# Patient Record
Sex: Male | Born: 1960 | Race: Black or African American | Hispanic: No | Marital: Married | Smoking: Never smoker
Health system: Southern US, Community
[De-identification: ages and names within clinical notes are randomized; demographics above are authoritative.]

## PROBLEM LIST (undated history)

## (undated) ENCOUNTER — Emergency Department (HOSPITAL_BASED_OUTPATIENT_CLINIC_OR_DEPARTMENT_OTHER): Discharge status: Absent without leave | Payer: BC Managed Care – PPO

## (undated) DIAGNOSIS — E7800 Pure hypercholesterolemia, unspecified: Secondary | ICD-10-CM

## (undated) HISTORY — PX: ROTATOR CUFF REPAIR: SHX139

## (undated) HISTORY — PX: OTHER SURGICAL HISTORY: SHX169

---

## 2006-05-13 ENCOUNTER — Ambulatory Visit (HOSPITAL_COMMUNITY): Admission: RE | Admit: 2006-05-13 | Discharge: 2006-05-13 | Payer: Self-pay | Admitting: Orthopaedic Surgery

## 2019-02-11 ENCOUNTER — Other Ambulatory Visit: Payer: Self-pay

## 2019-02-11 ENCOUNTER — Emergency Department (HOSPITAL_BASED_OUTPATIENT_CLINIC_OR_DEPARTMENT_OTHER)
Admission: EM | Admit: 2019-02-11 | Discharge: 2019-02-11 | Disposition: A | Discharge status: Home / Self Care | Payer: BC Managed Care – PPO | Attending: Emergency Medicine | Admitting: Emergency Medicine

## 2019-02-11 ENCOUNTER — Emergency Department (HOSPITAL_BASED_OUTPATIENT_CLINIC_OR_DEPARTMENT_OTHER): Payer: BC Managed Care – PPO

## 2019-02-11 ENCOUNTER — Encounter (HOSPITAL_BASED_OUTPATIENT_CLINIC_OR_DEPARTMENT_OTHER): Payer: Self-pay | Admitting: Emergency Medicine

## 2019-02-11 DIAGNOSIS — Y9389 Activity, other specified: Secondary | ICD-10-CM | POA: Diagnosis not present

## 2019-02-11 DIAGNOSIS — Y999 Unspecified external cause status: Secondary | ICD-10-CM | POA: Diagnosis not present

## 2019-02-11 DIAGNOSIS — Z79899 Other long term (current) drug therapy: Secondary | ICD-10-CM | POA: Diagnosis not present

## 2019-02-11 DIAGNOSIS — Y929 Unspecified place or not applicable: Secondary | ICD-10-CM | POA: Diagnosis not present

## 2019-02-11 DIAGNOSIS — W1789XA Other fall from one level to another, initial encounter: Secondary | ICD-10-CM | POA: Diagnosis not present

## 2019-02-11 DIAGNOSIS — W19XXXA Unspecified fall, initial encounter: Secondary | ICD-10-CM

## 2019-02-11 DIAGNOSIS — S8991XA Unspecified injury of right lower leg, initial encounter: Secondary | ICD-10-CM | POA: Diagnosis present

## 2019-02-11 HISTORY — DX: Pure hypercholesterolemia, unspecified: E78.00

## 2019-02-11 MED ORDER — HYDROCODONE-ACETAMINOPHEN 5-325 MG PO TABS: 1.0000 | ORAL_TABLET | Freq: Four times a day (QID) | ORAL | 0 refills | Status: DC | PRN

## 2019-02-11 NOTE — ED Provider Notes (Signed)
[facility] EMERGENCY DEPARTMENT Provider Note   CSN: [id number] Arrival date & time: 02/11/19  [id number]     History   Chief Complaint Chief Complaint  Patient presents with  . Fall    HPI Kenneth Gillespie is a 58 y.o. male.     Patient with a fall shortly prior to arrival.  He was up on a platform about 3 feet high stepped off got his leg caught his right calf area caught while falling.  Between the scaffolding and grill.  Since the time of the fall the left calf is swollen significantly.  No problems with a prior to the injury.  No other injuries.  Patient not on any medications.     Past Medical History:  Diagnosis Date  . Hypercholesterolemia     There are no active problems to display for this patient.   Past Surgical History:  Procedure Laterality Date  . c-spine    . ROTATOR CUFF REPAIR Bilateral         Home Medications    Prior to Admission medications   Medication Sig Start Date End Date Taking? Authorizing Provider  HYDROcodone-acetaminophen (NORCO/VICODIN) 5-325 MG tablet Take 1 tablet by mouth every 6 (six) hours as needed for moderate pain. 02/11/19   Vanetta MuldersZackowski, Kenneth Koller, MD    Family History No family history on file.  Social History Social History   Tobacco Use  . Smoking status: Not on file  Substance Use Topics  . Alcohol use: Not on file  . Drug use: Not on file     Allergies   Patient has no known allergies.   Review of Systems Review of Systems  Constitutional: Negative for chills and fever.  HENT: Negative for rhinorrhea and sore throat.   Eyes: Negative for visual disturbance.  Respiratory: Negative for cough and shortness of breath.   Cardiovascular: Positive for leg swelling. Negative for chest pain.  Gastrointestinal: Negative for abdominal pain, diarrhea, nausea and vomiting.  Genitourinary: Negative for dysuria.  Musculoskeletal: Negative for back pain and neck pain.  Skin: Negative for rash.  Neurological:  Negative for dizziness, light-headedness and headaches.  Hematological: Does not bruise/bleed easily.  Psychiatric/Behavioral: Negative for confusion.     Physical Exam Updated Vital Signs BP (!) 137/96   Pulse 60   Temp 98.3 F (36.8 C) (Oral)   Resp 18   Ht 1.829 m (6')   Wt 114.3 kg   SpO2 100%   BMI 34.18 kg/m   Physical Exam Vitals signs and nursing note reviewed.  Constitutional:      Appearance: Normal appearance. He is well-developed.  HENT:     Head: Normocephalic and atraumatic.  Eyes:     Extraocular Movements: Extraocular movements intact.     Conjunctiva/sclera: Conjunctivae normal.     Pupils: Pupils are equal, round, and reactive to light.  Neck:     Musculoskeletal: Normal range of motion and neck supple.  Cardiovascular:     Rate and Rhythm: Normal rate and regular rhythm.     Heart sounds: No murmur.  Pulmonary:     Effort: Pulmonary effort is normal. No respiratory distress.     Breath sounds: Normal breath sounds.  Abdominal:     Palpations: Abdomen is soft.     Tenderness: There is no abdominal tenderness.  Musculoskeletal:        General: Swelling and tenderness present.     Comments: Swelling to the right calf and anterior part of the leg.  No knee pain no swelling of the knee.  No swelling of the ankle.  Dorsalis pedis pulses 2+.  Good cap refill of the toes.  No obvious deformity.  Skin:    General: Skin is warm and dry.     Capillary Refill: Capillary refill takes less than 2 seconds.  Neurological:     General: No focal deficit present.     Mental Status: He is alert and oriented to person, place, and time.     Cranial Nerves: No cranial nerve deficit.     Sensory: No sensory deficit.     Motor: No weakness.      ED Treatments / Results  Labs (all labs ordered are listed, but only abnormal results are displayed) Labs Reviewed - No data to display  EKG None  Radiology Dg Tibia/fibula Left  Result Date: 02/11/2019 CLINICAL  DATA:  Fall, pain EXAM: LEFT TIBIA AND FIBULA - 2 VIEW COMPARISON:  None. FINDINGS: No fracture or dislocation of the left tibia or fibula. Soft tissues are unremarkable. IMPRESSION: No fracture or dislocation of the left tibia or fibula. Soft tissues are unremarkable. Electronically Signed   By: Eddie Candle M.D.   On: 02/11/2019 20:16    Procedures Procedures (including critical care time)  Medications Ordered in ED Medications - No data to display   Initial Impression / Assessment and Plan / ED Course  I have reviewed the triage vital signs and the nursing notes.  Pertinent labs & imaging results that were available during my care of the patient were reviewed by me and considered in my medical decision making (see chart for details).        X-ray showed no bony injury.  Everything soft tissue.  Patient able to ambulate.  Will treat with hydrocodone.  Elevation.  Work note to be out of work for 2 days.  Follow-up with sports medicine if things do not improve.  Final Clinical Impressions(s) / ED Diagnoses   Final diagnoses:  Fall, initial encounter  Lower leg injury, right, initial encounter    ED Discharge Orders         Ordered    HYDROcodone-acetaminophen (NORCO/VICODIN) 5-325 MG tablet  Every 6 hours PRN     02/11/19 2029           Fredia Sorrow, MD 02/11/19 2033

## 2019-02-11 NOTE — Discharge Instructions (Addendum)
X-ray showed no bony injury.  Elevate the right leg is much as possible.  Take the hydrocodone as needed for pain.  Work note provided to be out of work for the next 2 days.  Follow-up with sports medicine or your doctor if things are not improving over a couple days.

## 2019-02-11 NOTE — ED Triage Notes (Signed)
Pt was working outside on his ladder when he fell off, approx 3 feet high. Foot got caught when he was falling and now his left calf is swollen. Pt has home ice pack on it during triage

## 2019-02-20 ENCOUNTER — Ambulatory Visit: Payer: Self-pay

## 2019-02-20 ENCOUNTER — Other Ambulatory Visit: Payer: Self-pay

## 2019-02-20 ENCOUNTER — Ambulatory Visit: Payer: BC Managed Care – PPO | Admitting: Family Medicine

## 2019-02-20 ENCOUNTER — Encounter: Payer: Self-pay | Admitting: Family Medicine

## 2019-02-20 VITALS — BP 150/90 | Ht 72.0 in | Wt 250.0 lb

## 2019-02-20 DIAGNOSIS — M79605 Pain in left leg: Secondary | ICD-10-CM | POA: Diagnosis not present

## 2019-02-20 DIAGNOSIS — T148XXA Other injury of unspecified body region, initial encounter: Secondary | ICD-10-CM | POA: Diagnosis not present

## 2019-02-20 NOTE — Progress Notes (Signed)
Kenneth Gillespie - 58 y.o. male MRN [id number]  Date of birth: 04/11/1961  SUBJECTIVE:  Including CC & ROS.  Chief Complaint  Patient presents with  . Leg Injury    left leg x 02/11/2019    Kenneth Gillespie is a 58 y.o. male that is presenting with left lower leg pain.  He had an accident about a week and a half ago where he fell backwards in his anterior tibia was stuck against a grill.  He was evaluated the emergency room and no signs of fracture occurred.  Since that time he is ongoing swelling and pain over the anterior and distal aspect of the lower leg.  Denies any redness.  The pain is occurring over the anterior medial aspect of the proximal tibia. No numbness or tingling. Having some ecchymosis around the ankle.  Independent review of the left tibia-fibula x-ray from 8/23 shows no fracture   Review of Systems  Constitutional: Negative for fever.  HENT: Negative for congestion.   Respiratory: Negative for cough.   Cardiovascular: Positive for leg swelling. Negative for chest pain.  Gastrointestinal: Negative for abdominal pain.  Musculoskeletal: Positive for gait problem.  Skin: Positive for color change.  Neurological: Negative for weakness.  Hematological: Negative for adenopathy.    HISTORY: Past Medical, Surgical, Social, and Family History Reviewed & Updated per EMR.   Pertinent Historical Findings include:  Past Medical History:  Diagnosis Date  . Hypercholesterolemia     Past Surgical History:  Procedure Laterality Date  . c-spine    . ROTATOR CUFF REPAIR Bilateral     No Known Allergies  No family history on file.   Social History   Socioeconomic History  . Marital status: Married    Spouse name: Not on file  . Number of children: Not on file  . Years of education: Not on file  . Highest education level: Not on file  Occupational History  . Not on file  Social Needs  . Financial resource strain: Not on file  . Food insecurity    Worry: Not on  file    Inability: Not on file  . Transportation needs    Medical: Not on file    Non-medical: Not on file  Tobacco Use  . Smoking status: Never Smoker  . Smokeless tobacco: Never Used  Substance and Sexual Activity  . Alcohol use: Not on file  . Drug use: Not on file  . Sexual activity: Not on file  Lifestyle  . Physical activity    Days per week: Not on file    Minutes per session: Not on file  . Stress: Not on file  Relationships  . Social Herbalist on phone: Not on file    Gets together: Not on file    Attends religious service: Not on file    Active member of club or organization: Not on file    Attends meetings of clubs or organizations: Not on file    Relationship status: Not on file  . Intimate partner violence    Fear of current or ex partner: Not on file    Emotionally abused: Not on file    Physically abused: Not on file    Forced sexual activity: Not on file  Other Topics Concern  . Not on file  Social History Narrative  . Not on file     PHYSICAL EXAM:  VS: BP (!) 150/90   Ht 6' (1.829 m)   [employer]  250 lb (113.4 kg)   BMI 33.91 kg/m  Physical Exam Gen: NAD, alert, cooperative with exam, well-appearing ENT: normal lips, normal nasal mucosa,  Eye: normal EOM, normal conjunctiva and lids CV:  Unilateral left lower leg swelling, +2 pedal pulses   Resp: no accessory muscle use, non-labored,  Skin: no rashes, no areas of induration  Neuro: normal tone, normal sensation to touch Psych:  normal insight, alert and oriented MSK:  Left leg:  Obvious swelling around the calf and distally  Normal plantarflexion and dorsal flexion  Normal knee ROM  Normal strength to resistance  TTP over the medial proximal anterior tibia NVI  Limited ultrasound: left lower leg:  Large hematoma observed over the anterior medial proximal tibia.  It is roughly 4 cm in diameter horizontally. There is distention of the veins in the posterior compartment but they are  all compressible. No fracture of the tibia appreciated. Normal-appearing posterior tibialis at the ankle. No ankle effusion. Significant soft tissue swelling throughout the lower extremity.   Summary: Large hematoma over the anterior proximal tibia medially  Ultrasound and interpretation by Clare GandyJeremy Keelie Zemanek, MD     Aspiration/Injection Procedure Note Kenneth Gillespie 01/06/1961  Procedure: Aspiration Indications: Left lower leg pain  Procedure Details Consent: Risks of procedure as well as the alternatives and risks of each were explained to the (patient/caregiver).  Consent for procedure obtained. Time Out: Verified patient identification, verified procedure, site/side was marked, verified correct patient position, special equipment/implants available, medications/allergies/relevent history reviewed, required imaging and test results available.  Performed.  The area was cleaned with iodine and alcohol swabs.    The left lower leg hematoma was injected using 6 cc's of 1% lidocaine with a 25 1 1/2" needle.  An 18-gauge 1-1/2 inch needle was inserted for aspiration.  Ultrasound was used. Images were obtained in long views showing the injection.    Amount of Fluid Aspirated: minimal amount Character of Fluid: bloody Fluid was sent for:n/a  A sterile dressing was applied.  Patient did tolerate procedure well.     ASSESSMENT & PLAN:   Left leg pain Having significant swelling and hematoma apparent on ultrasound.  Having venous distention but compressible so less likely for DVT. -Aspiration but unable to generate significant sample.  Was able to express more as his pain tolerated. -Counseled on taking a baby dose aspirin. -Counseled on compression, heat, and movement -Given indications for infection and DVT. -Follow-up this Friday

## 2019-02-20 NOTE — Assessment & Plan Note (Signed)
Having significant swelling and hematoma apparent on ultrasound.  Having venous distention but compressible so less likely for DVT. -Aspiration but unable to generate significant sample.  Was able to express more as his pain tolerated. -Counseled on taking a baby dose aspirin. -Counseled on compression, heat, and movement -Given indications for infection and DVT. -Follow-up this Friday

## 2019-02-20 NOTE — Patient Instructions (Signed)
Nice to meet you Please use the ace wrap  Please pump your foot like you're pushing on and off the gas  Please try heat on the area  Please let me know if the pain becomes worse, gets red or more swollen.   Please send me a message in MyChart with any questions or updates.  Please see [place] back on Friday.   --Dr. Raeford Razor

## 2019-02-21 ENCOUNTER — Ambulatory Visit (HOSPITAL_BASED_OUTPATIENT_CLINIC_OR_DEPARTMENT_OTHER)
Admission: RE | Admit: 2019-02-21 | Discharge: 2019-02-21 | Disposition: A | Discharge status: Home / Self Care | Payer: BC Managed Care – PPO | Source: Ambulatory Visit | Attending: Family Medicine | Admitting: Family Medicine

## 2019-02-21 ENCOUNTER — Other Ambulatory Visit: Payer: Self-pay | Admitting: Family Medicine

## 2019-02-21 DIAGNOSIS — M79605 Pain in left leg: Secondary | ICD-10-CM | POA: Diagnosis present

## 2019-02-21 NOTE — Progress Notes (Signed)
Spoke with patient about leg swelling. Will go ahead and order duplex.   Rosemarie Ax, MD [facility] 02/21/2019, 8:20 AM

## 2019-02-22 ENCOUNTER — Telehealth: Payer: Self-pay | Admitting: Family Medicine

## 2019-02-22 NOTE — Telephone Encounter (Signed)
Called patient about no blood clot.   Rosemarie Ax, MD [facility] 02/22/2019, 8:35 AM

## 2019-02-23 ENCOUNTER — Other Ambulatory Visit: Payer: Self-pay

## 2019-02-23 ENCOUNTER — Ambulatory Visit: Payer: BC Managed Care – PPO | Admitting: Family Medicine

## 2019-02-23 ENCOUNTER — Encounter: Payer: Self-pay | Admitting: Family Medicine

## 2019-02-23 DIAGNOSIS — M79605 Pain in left leg: Secondary | ICD-10-CM

## 2019-02-23 NOTE — Patient Instructions (Signed)
Good to see you Please continue with the compression and elevation  You don't have to wrap it at night  Please try ibuprofen or tylenol for pain. You can take these together at different times.  Please try heat and massage   Please send me a message in MyChart with any questions or updates.  Please see me back in 3 weeks.   --Dr. Raeford Razor

## 2019-02-23 NOTE — Assessment & Plan Note (Signed)
Pain and swelling have improved.  Hematoma is still tender to touch.  He was negative for blood clot with imaging. -Counseled on compression and supportive care. -Follow-up in 3 weeks.

## 2019-02-23 NOTE — Progress Notes (Signed)
Kenneth Gillespie - 58 y.o. male MRN [id number]  Date of birth: 01/18/1961  SUBJECTIVE:  Including CC & ROS.  Chief Complaint  Patient presents with  . Follow-up    follow up for left leg    Kenneth Gillespie is a 58 y.o. male that is following up for his left leg pain and swelling.  Imaging was negative for blood clot this past week.  He is having improvement of his swelling.  He still has pain and seems to be worse at night.  He is able to walk and do some of the normal activities.   Review of Systems  Constitutional: Negative for fever.  HENT: Negative for congestion.   Respiratory: Negative for cough.   Cardiovascular: Positive for leg swelling. Negative for chest pain.  Gastrointestinal: Negative for abdominal pain.  Musculoskeletal: Negative for gait problem.  Neurological: Negative for weakness.  Hematological: Negative for adenopathy.    HISTORY: Past Medical, Surgical, Social, and Family History Reviewed & Updated per EMR.   Pertinent Historical Findings include:  Past Medical History:  Diagnosis Date  . Hypercholesterolemia     Past Surgical History:  Procedure Laterality Date  . c-spine    . ROTATOR CUFF REPAIR Bilateral     No Known Allergies  No family history on file.   Social History   Socioeconomic History  . Marital status: Married    Spouse name: Not on file  . Number of children: Not on file  . Years of education: Not on file  . Highest education level: Not on file  Occupational History  . Not on file  Social Needs  . Financial resource strain: Not on file  . Food insecurity    Worry: Not on file    Inability: Not on file  . Transportation needs    Medical: Not on file    Non-medical: Not on file  Tobacco Use  . Smoking status: Never Smoker  . Smokeless tobacco: Never Used  Substance and Sexual Activity  . Alcohol use: Not on file  . Drug use: Not on file  . Sexual activity: Not on file  Lifestyle  . Physical activity    Days per  week: Not on file    Minutes per session: Not on file  . Stress: Not on file  Relationships  . Social Herbalist on phone: Not on file    Gets together: Not on file    Attends religious service: Not on file    Active member of club or organization: Not on file    Attends meetings of clubs or organizations: Not on file    Relationship status: Not on file  . Intimate partner violence    Fear of current or ex partner: Not on file    Emotionally abused: Not on file    Physically abused: Not on file    Forced sexual activity: Not on file  Other Topics Concern  . Not on file  Social History Narrative  . Not on file     PHYSICAL EXAM:  VS: BP 135/88   Ht 6' (1.829 m)   Wt 250 lb (113.4 kg)   BMI 33.91 kg/m  Physical Exam Gen: NAD, alert, cooperative with exam, well-appearing ENT: normal lips, normal nasal mucosa,  Eye: normal EOM, normal conjunctiva and lids CV:   +2 pedal pulses   Resp: no accessory muscle use, non-labored,  Skin: no rashes, no areas of induration  Neuro: normal tone, normal  sensation to touch Psych:  normal insight, alert and oriented MSK:  Left leg: Swelling has improved. Still having pain to palpation over the hematoma and anterior tibia. Normal knee range of motion. Normal ankle range of motion. Normal strength to resistance. Neurovascular intact     ASSESSMENT & PLAN:   Left leg pain Pain and swelling have improved.  Hematoma is still tender to touch.  He was negative for blood clot with imaging. -Counseled on compression and supportive care. -Follow-up in 3 weeks.

## 2019-03-20 ENCOUNTER — Other Ambulatory Visit: Payer: Self-pay

## 2019-03-20 ENCOUNTER — Ambulatory Visit: Payer: BC Managed Care – PPO | Admitting: Family Medicine

## 2019-03-20 ENCOUNTER — Ambulatory Visit: Payer: Self-pay

## 2019-03-20 VITALS — BP 132/80 | Ht 72.0 in | Wt 250.0 lb

## 2019-03-20 DIAGNOSIS — G8929 Other chronic pain: Secondary | ICD-10-CM

## 2019-03-20 DIAGNOSIS — M79605 Pain in left leg: Secondary | ICD-10-CM | POA: Diagnosis not present

## 2019-03-20 DIAGNOSIS — M25562 Pain in left knee: Secondary | ICD-10-CM | POA: Diagnosis not present

## 2019-03-20 DIAGNOSIS — M25561 Pain in right knee: Secondary | ICD-10-CM

## 2019-03-20 MED ORDER — METHYLPREDNISOLONE ACETATE 40 MG/ML IJ SUSP: 40.0000 mg | Freq: Once | INTRAMUSCULAR | Status: AC

## 2019-03-20 MED ORDER — PENNSAID 2 % TD SOLN: 1.0000 "application " | Freq: Two times a day (BID) | TRANSDERMAL | 3 refills | Status: DC

## 2019-03-20 NOTE — Assessment & Plan Note (Signed)
Acute on chronic in nature.  Likely related to degenerative meniscus and/or arthritic type changes. -Injections today. -Pennsaid. -Counseled on home exercise therapy and supportive care. -If no improvement consider physical therapy.  Consider imaging

## 2019-03-20 NOTE — Patient Instructions (Signed)
Good to see you Please try ice  Please try the exercises  Please try the rub on medicine for the knees  Please try heat on the hematoma   Please send me a message in MyChart with any questions or updates.  Please see me back in 4 weeks.   --Dr. Raeford Razor

## 2019-03-20 NOTE — Assessment & Plan Note (Signed)
Hematoma still present but pain has improved.  Still having some swelling intermittently. -Counseled on supportive care. - provided indications to follow up.

## 2019-03-20 NOTE — Progress Notes (Signed)
[name] - 58 y.o. male MRN [id number]  Date of birth: [birth year]  SUBJECTIVE:  Including CC & ROS.  No chief complaint on file.   Kenneth Gillespie is a 58 y.o. male that is following up for his left leg hematoma and presenting with acute on chronic bilateral knee pain.  The hematoma has improved in size.  He does not have pain when it is touched now.  He still does have swelling in the left lower extremity intermittently.  He tries heat and massage on the area.  Denies any redness.  Presenting with acute on chronic bilateral knee pain.  The knee pain started a few weeks ago.  He denies any inciting event or trauma.  He has had steroid injections in the past.  Is been several years since his injection.  The pain is occurring in the medial joint line.  He has clicking and popping.  Denies any mechanical symptoms.  Pain is localized to the knee.  No numbness or tingling   Review of Systems  Constitutional: Negative for fever.  HENT: Negative for congestion.   Respiratory: Negative for cough.   Cardiovascular: Negative for chest pain.  Gastrointestinal: Negative for abdominal pain.  Musculoskeletal: Positive for arthralgias.  Skin: Negative for color change.  Neurological: Negative for weakness.  Hematological: Negative for adenopathy.    HISTORY: Past Medical, Surgical, Social, and Family History Reviewed & Updated per EMR.   Pertinent Historical Findings include:  Past Medical History:  Diagnosis Date  . Hypercholesterolemia     Past Surgical History:  Procedure Laterality Date  . c-spine    . ROTATOR CUFF REPAIR Bilateral     No Known Allergies  No family history on file.   Social History   Socioeconomic History  . Marital status: Married    Spouse name: Not on file  . Number of children: Not on file  . Years of education: Not on file  . Highest education level: Not on file  Occupational History  . Not on file  Social Needs  . Financial resource strain: Not  on file  . Food insecurity    Worry: Not on file    Inability: Not on file  . Transportation needs    Medical: Not on file    Non-medical: Not on file  Tobacco Use  . Smoking status: Never Smoker  . Smokeless tobacco: Never Used  Substance and Sexual Activity  . Alcohol use: Not on file  . Drug use: Not on file  . Sexual activity: Not on file  Lifestyle  . Physical activity    Days per week: Not on file    Minutes per session: Not on file  . Stress: Not on file  Relationships  . Social Herbalist on phone: Not on file    Gets together: Not on file    Attends religious service: Not on file    Active member of club or organization: Not on file    Attends meetings of clubs or organizations: Not on file    Relationship status: Not on file  . Intimate partner violence    Fear of current or ex partner: Not on file    Emotionally abused: Not on file    Physically abused: Not on file    Forced sexual activity: Not on file  Other Topics Concern  . Not on file  Social History Narrative  . Not on file     PHYSICAL EXAM:  VS:  BP 132/80   Ht 6' (1.829 m)   Wt 250 lb (113.4 kg)   BMI 33.91 kg/m  Physical Exam Gen: NAD, alert, cooperative with exam, well-appearing ENT: normal lips, normal nasal mucosa,  Eye: normal EOM, normal conjunctiva and lids CV:   +2 pedal pulses   Resp: no accessory muscle use, non-labored,  Skin: no rashes, no areas of induration  Neuro: normal tone, normal sensation to touch Psych:  normal insight, alert and oriented MSK:  Left lower leg:  Hematoma appreciated in the left medial proximal tibia.  No tenderness to palpation.  No overlying redness. Left and right knee: No obvious effusion. Tenderness to palpation of the medial joint line bilaterally. Normal range of motion. Normal strength resistance. Normal McMurray's test. No instability with valgus or varus stress testing. Neurovascular intact   Aspiration/Injection Procedure  Note Kenneth Gillespie 08-08-1960  Procedure: Injection Indications: Left knee pain  Procedure Details Consent: Risks of procedure as well as the alternatives and risks of each were explained to the (patient/caregiver).  Consent for procedure obtained. Time Out: Verified patient identification, verified procedure, site/side was marked, verified correct patient position, special equipment/implants available, medications/allergies/relevent history reviewed, required imaging and test results available.  Performed.  The area was cleaned with iodine and alcohol swabs.    The left knee superior lateral suprapatellar pouch was injected using 1 cc's of 40 mg Depo-Medrol and 4 cc's of 0.25% bupivacaine with a 25 1 1/2" needle.  Ultrasound was used. Images were obtained in long views showing the injection.     A sterile dressing was applied.  Patient did tolerate procedure well.    Aspiration/Injection Procedure Note Kenneth Gillespie 1960-10-22  Procedure: Injection Indications: Right knee pain  Procedure Details Consent: Risks of procedure as well as the alternatives and risks of each were explained to the (patient/caregiver).  Consent for procedure obtained. Time Out: Verified patient identification, verified procedure, site/side was marked, verified correct patient position, special equipment/implants available, medications/allergies/relevent history reviewed, required imaging and test results available.  Performed.  The area was cleaned with iodine and alcohol swabs.    The right knee superior lateral suprapatellar pouch was injected using 1 cc's of 40 mg Depo-Medrol and 4 cc's of 0.25% bupivacaine with a 25 1 1/2" needle.  Ultrasound was used. Images were obtained in long views showing the injection.     A sterile dressing was applied.  Patient did tolerate procedure well.    ASSESSMENT & PLAN:   Chronic pain of both knees Acute on chronic in nature.  Likely related to degenerative  meniscus and/or arthritic type changes. -Injections today. -Pennsaid. -Counseled on home exercise therapy and supportive care. -If no improvement consider physical therapy.  Consider imaging  Left leg pain Hematoma still present but pain has improved.  Still having some swelling intermittently. -Counseled on supportive care. - provided indications to follow up.

## 2019-04-17 ENCOUNTER — Other Ambulatory Visit: Payer: Self-pay

## 2019-04-17 ENCOUNTER — Ambulatory Visit: Payer: BC Managed Care – PPO | Admitting: Family Medicine

## 2019-04-17 ENCOUNTER — Encounter: Payer: Self-pay | Admitting: Family Medicine

## 2019-04-17 DIAGNOSIS — M79605 Pain in left leg: Secondary | ICD-10-CM

## 2019-04-17 NOTE — Assessment & Plan Note (Signed)
Hematoma is slowly improving. -Counseled on supportive care. -Can follow-up as needed.

## 2019-04-17 NOTE — Progress Notes (Signed)
RACE LATOUR - 58 y.o. male MRN [id number]  Date of birth: 16-Mar-1961  SUBJECTIVE:  Including CC & ROS.  Chief Complaint  Patient presents with  . Follow-up    follow up for right leg    Mitesh C Cauble is a 58 y.o. male that is following up for his hematoma.  He reports that he is doing well with no pain.  Swelling is slowly improved and the pain has subsided.  Denies any redness.  Feels like it is almost back to normal.   Review of Systems  Constitutional: Negative for fever.  HENT: Negative for congestion.   Respiratory: Negative for cough.   Cardiovascular: Negative for chest pain.  Gastrointestinal: Negative for abdominal pain.  Musculoskeletal: Negative for gait problem.  Neurological: Negative for weakness.  Hematological: Negative for adenopathy.    HISTORY: Past Medical, Surgical, Social, and Family History Reviewed & Updated per EMR.   Pertinent Historical Findings include:  Past Medical History:  Diagnosis Date  . Hypercholesterolemia     Past Surgical History:  Procedure Laterality Date  . c-spine    . ROTATOR CUFF REPAIR Bilateral     No Known Allergies  No family history on file.   Social History   Socioeconomic History  . Marital status: Married    Spouse name: Not on file  . Number of children: Not on file  . Years of education: Not on file  . Highest education level: Not on file  Occupational History  . Not on file  Social Needs  . Financial resource strain: Not on file  . Food insecurity    Worry: Not on file    Inability: Not on file  . Transportation needs    Medical: Not on file    Non-medical: Not on file  Tobacco Use  . Smoking status: Never Smoker  . Smokeless tobacco: Never Used  Substance and Sexual Activity  . Alcohol use: Not on file  . Drug use: Not on file  . Sexual activity: Not on file  Lifestyle  . Physical activity    Days per week: Not on file    Minutes per session: Not on file  . Stress: Not on file   Relationships  . Social Musician on phone: Not on file    Gets together: Not on file    Attends religious service: Not on file    Active member of club or organization: Not on file    Attends meetings of clubs or organizations: Not on file    Relationship status: Not on file  . Intimate partner violence    Fear of current or ex partner: Not on file    Emotionally abused: Not on file    Physically abused: Not on file    Forced sexual activity: Not on file  Other Topics Concern  . Not on file  Social History Narrative  . Not on file     PHYSICAL EXAM:  VS: BP (!) 143/87   Pulse 60   Ht 6' (1.829 m)   Wt 250 lb (113.4 kg)   BMI 33.91 kg/m  Physical Exam Gen: NAD, alert, cooperative with exam, well-appearing ENT: normal lips, normal nasal mucosa,  Eye: normal EOM, normal conjunctiva and lids CV:  no edema, +2 pedal pulses   Resp: no accessory muscle use, non-labored,   Skin: no rashes, no areas of induration  Neuro: normal tone, normal sensation to touch Psych:  normal insight, alert and oriented  MSK:  Left tibia: Hematoma still palpated in the left proximal medial tibia. No redness. Normal knee range of motion. Normal plantarflexion and dorsiflexion. Neurovascularly intact     ASSESSMENT & PLAN:   Left leg pain Hematoma is slowly improving. -Counseled on supportive care. -Can follow-up as needed.

## 2020-10-14 IMAGING — DX LEFT TIBIA AND FIBULA - 2 VIEW
4 series · 4 of 4 positions shown · non-contrast
Comparison: None.

CLINICAL DATA: Fall, pain

EXAM:
LEFT TIBIA AND FIBULA - 2 VIEW

[tibia ap (1 of 2)]
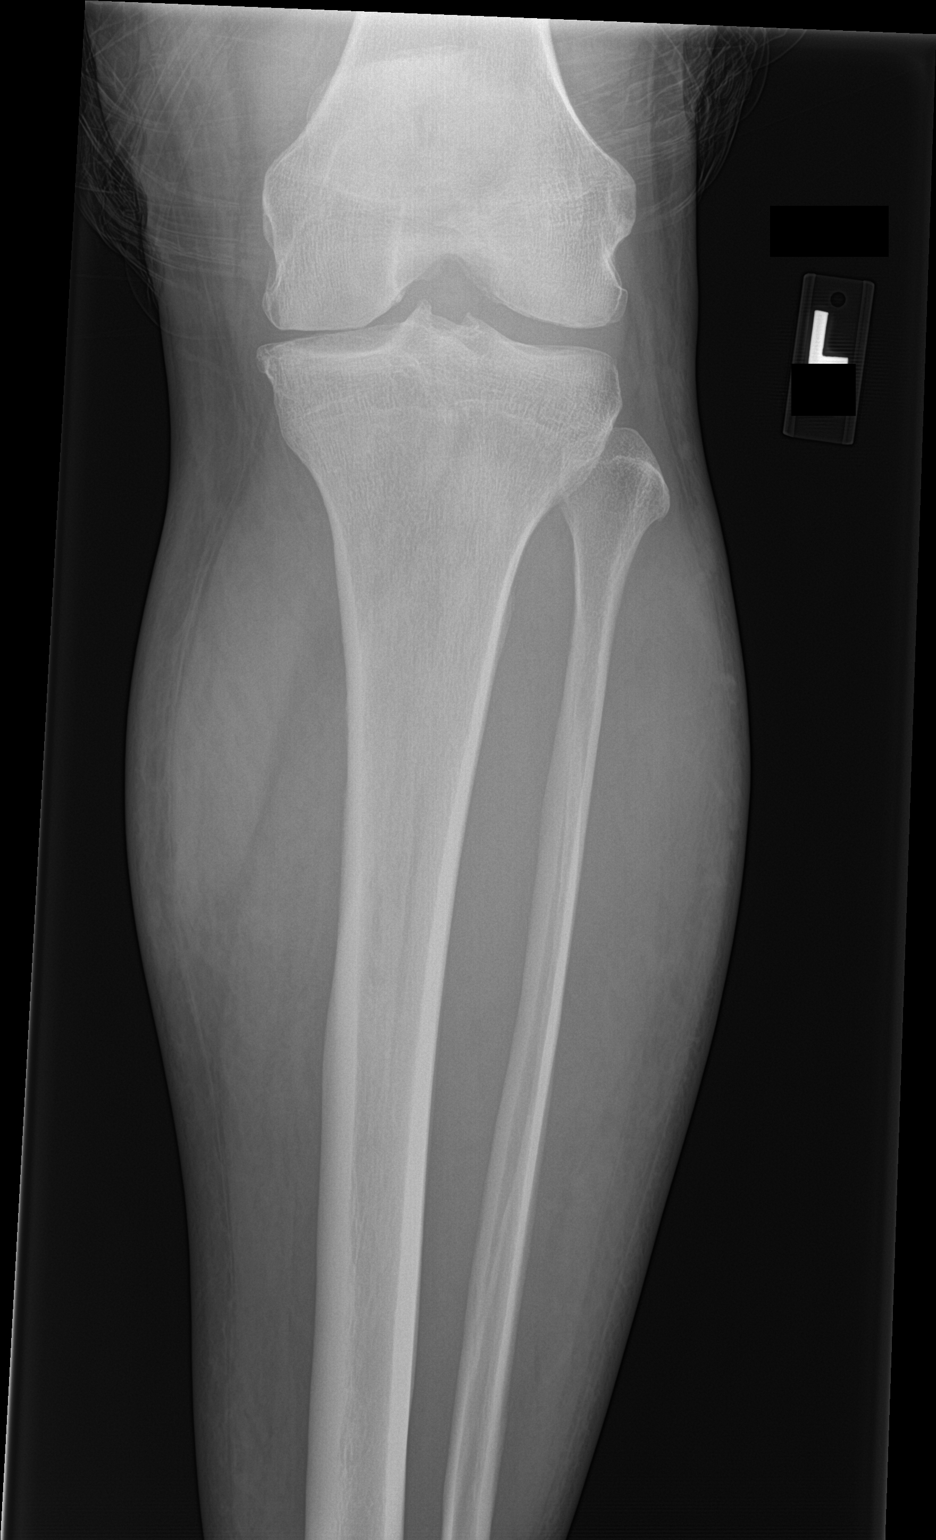

[tibia ap (2 of 2)]
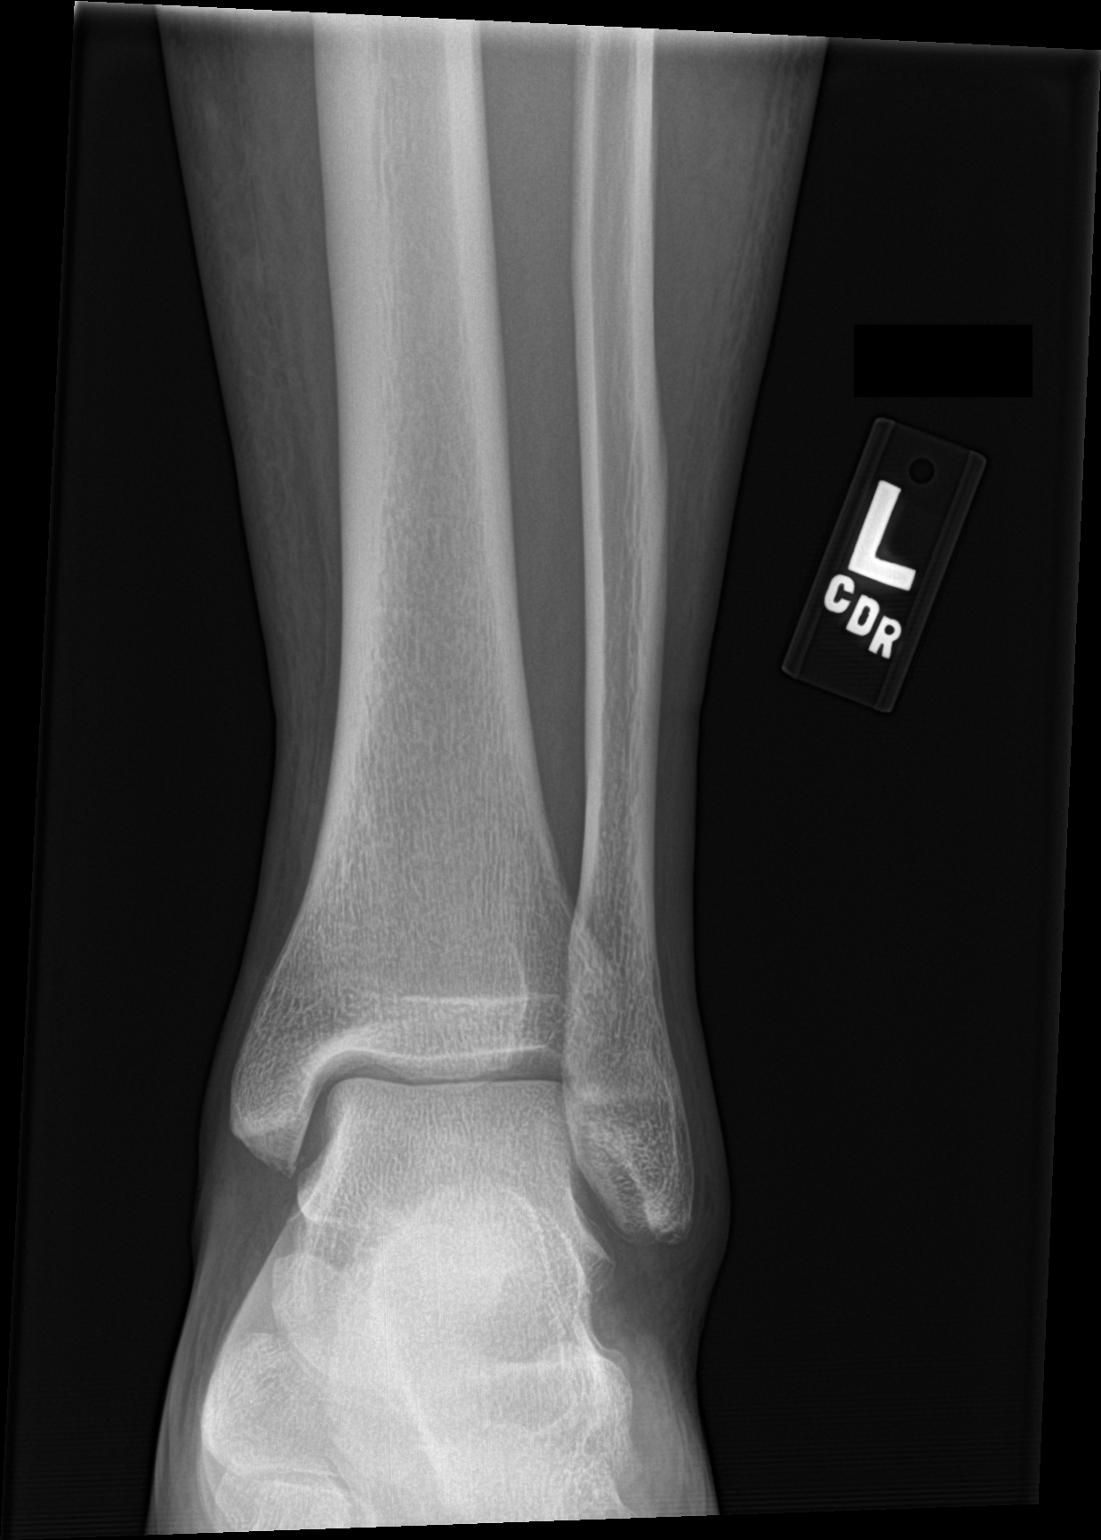

[tibia lat (1 of 2)]
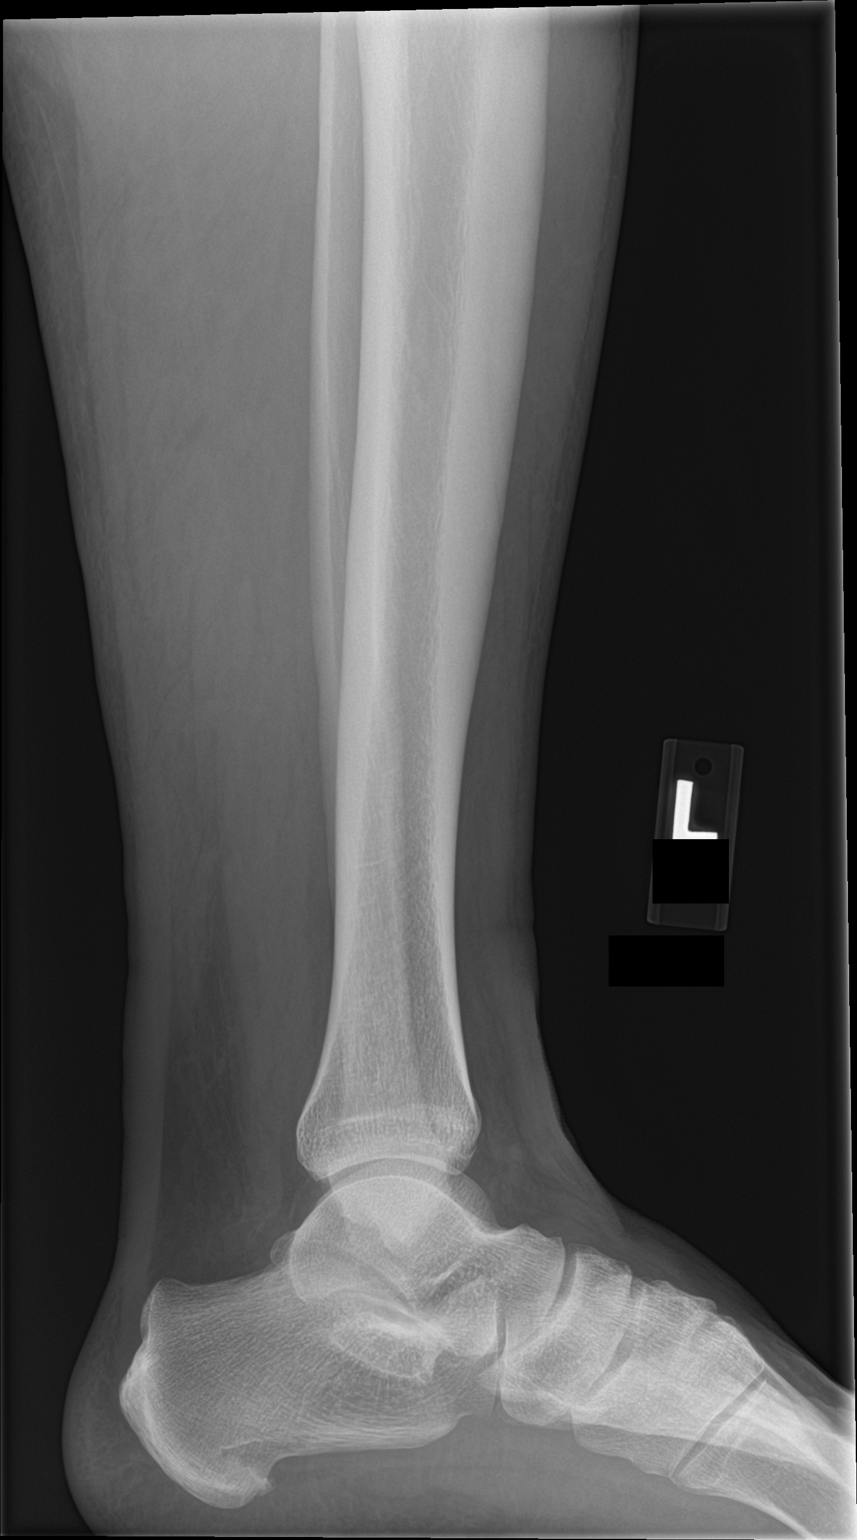

[tibia lat (2 of 2)]
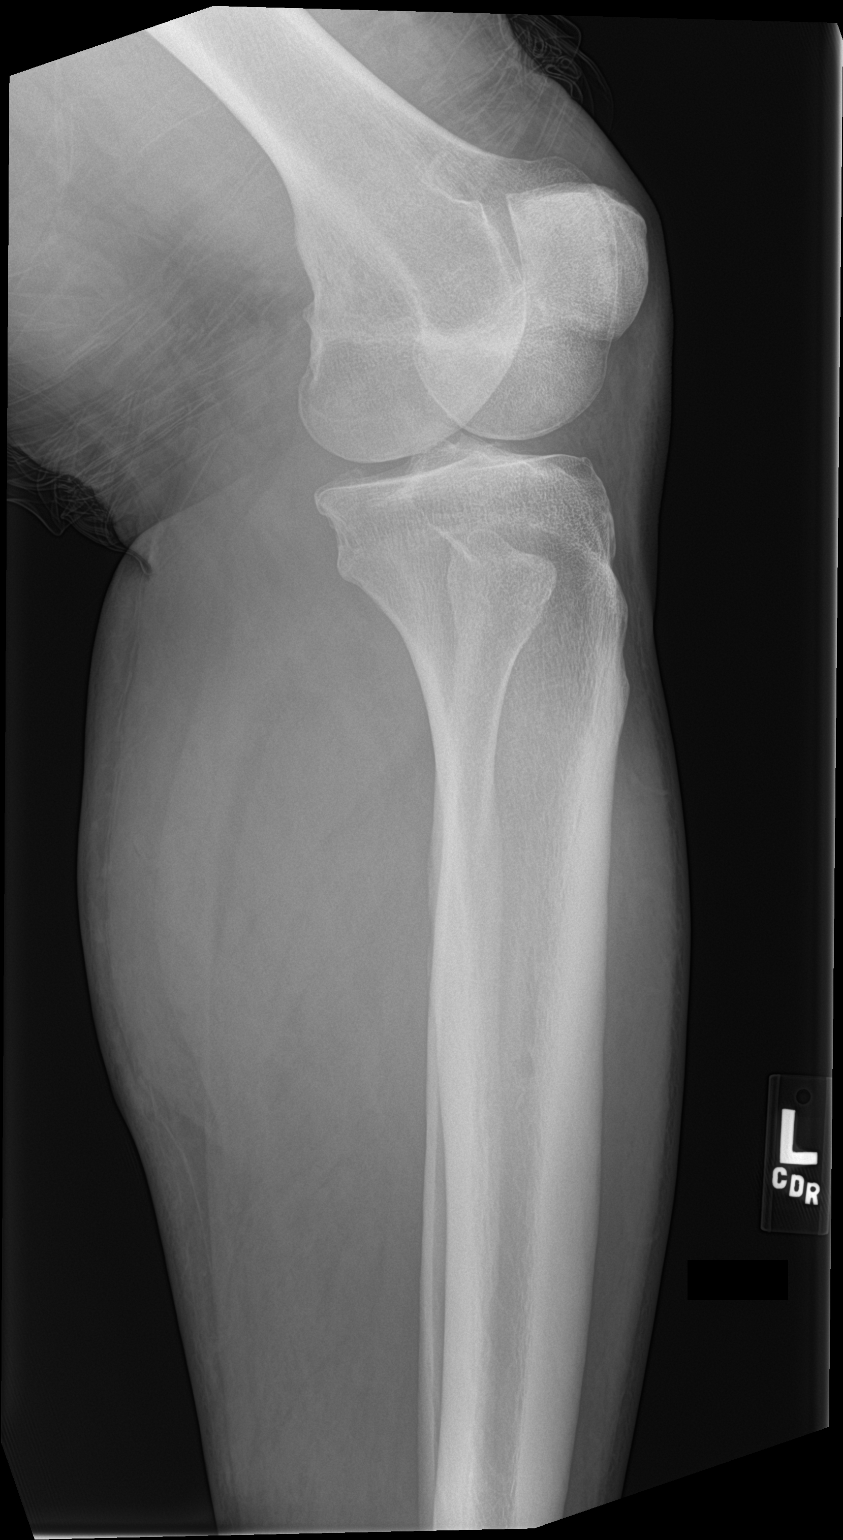

[4 of 4 positions shown; findings below may reference images not displayed]

FINDINGS: No fracture or dislocation of the left tibia or fibula. Soft tissues
are unremarkable.
IMPRESSION: No fracture or dislocation of the left tibia or fibula. Soft tissues
are unremarkable.

## 2020-10-24 IMAGING — US US EXTREM LOW VENOUS*L*
2 series · 13 of 24 positions shown · non-contrast
Comparison: None.

CLINICAL DATA: Left lower extremity injury



[Series 1: us extrem low venous*left* · 38 acquisitions, 12 frames shown (1 of 2)]
[im 1/38]
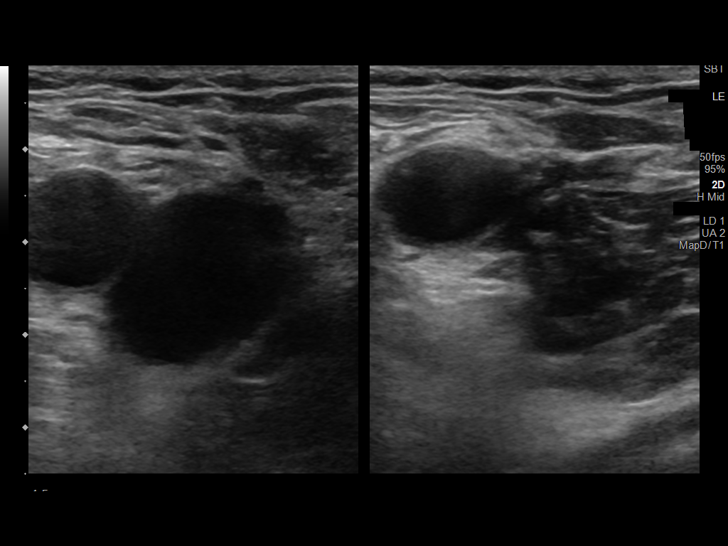
[im 4/38]
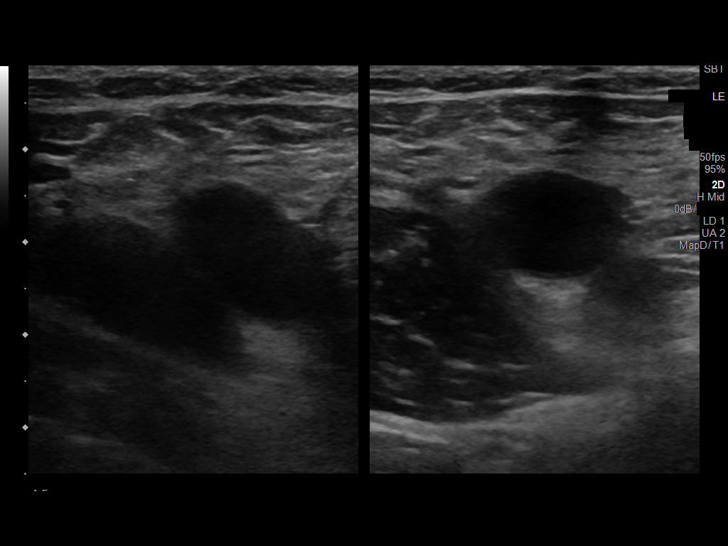
[im 8/38]
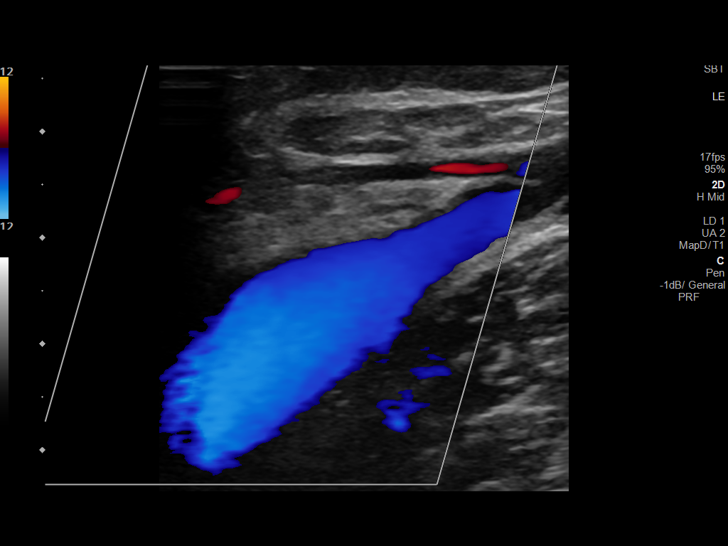
[im 11/38]
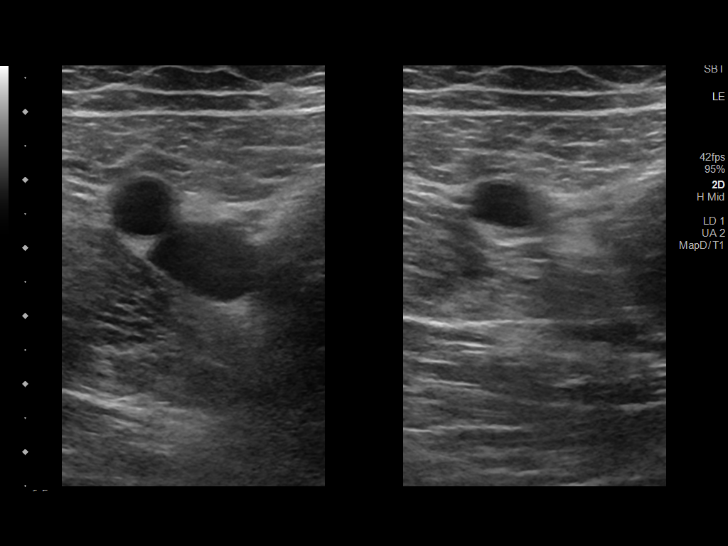
[im 15/38]
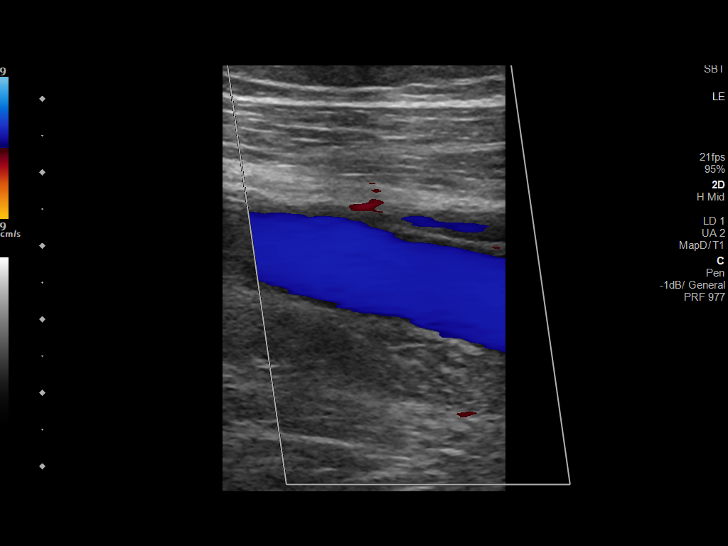
[im 18/38]
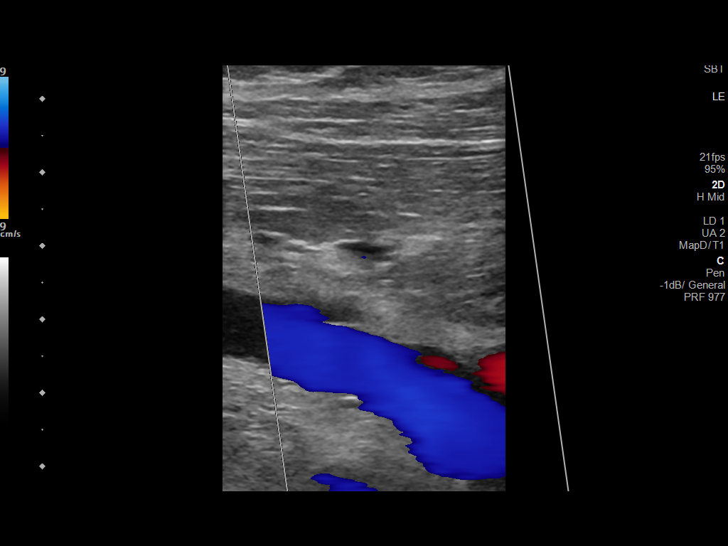
[im 23/38]
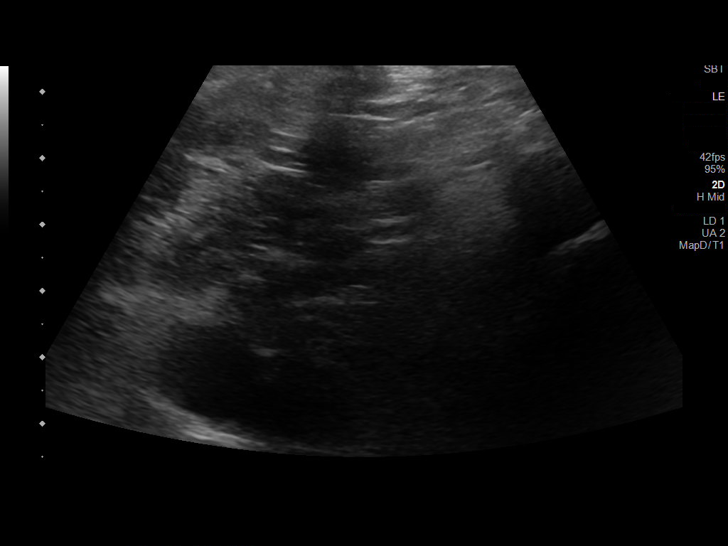
[im 23/38]
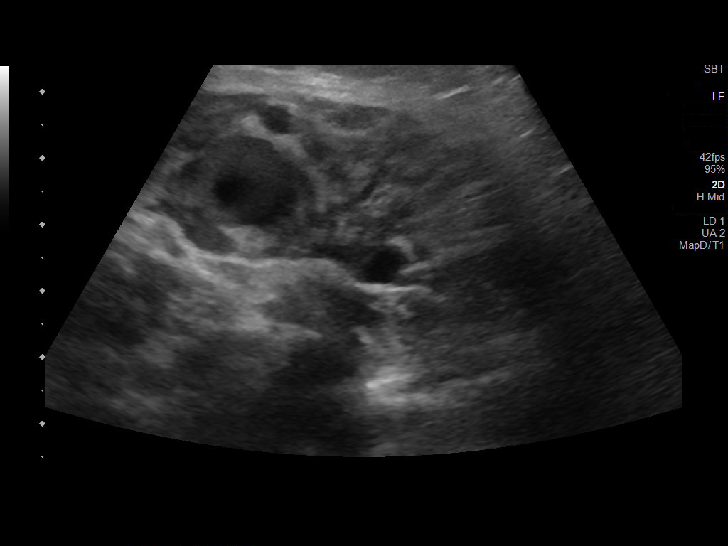
[im 27/38]
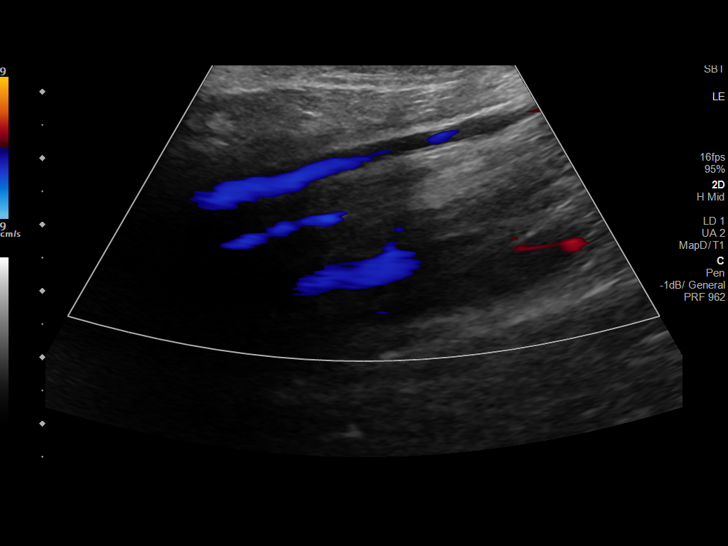
[im 30/38]
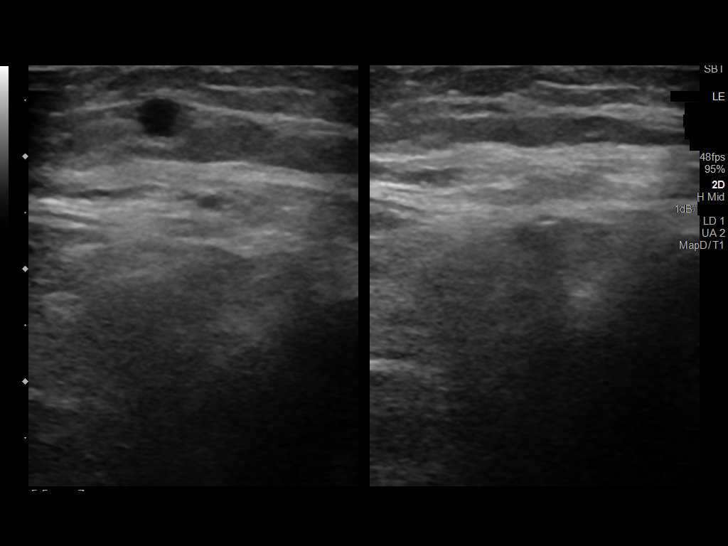
[im 34/38]
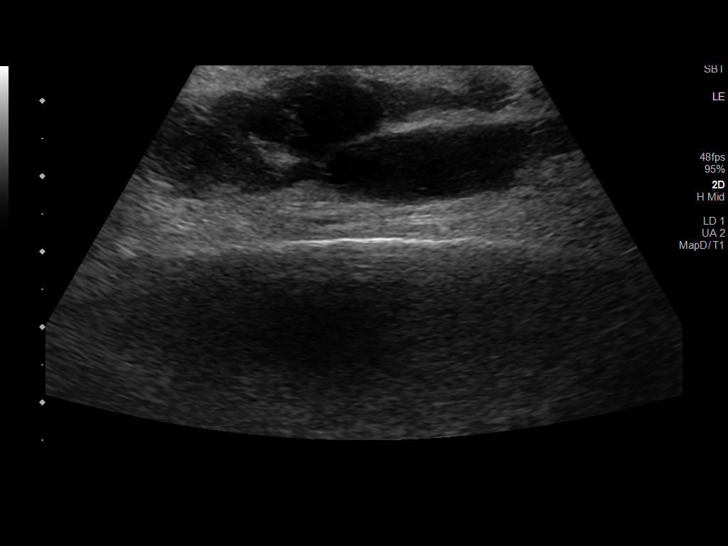
[im 38/38]
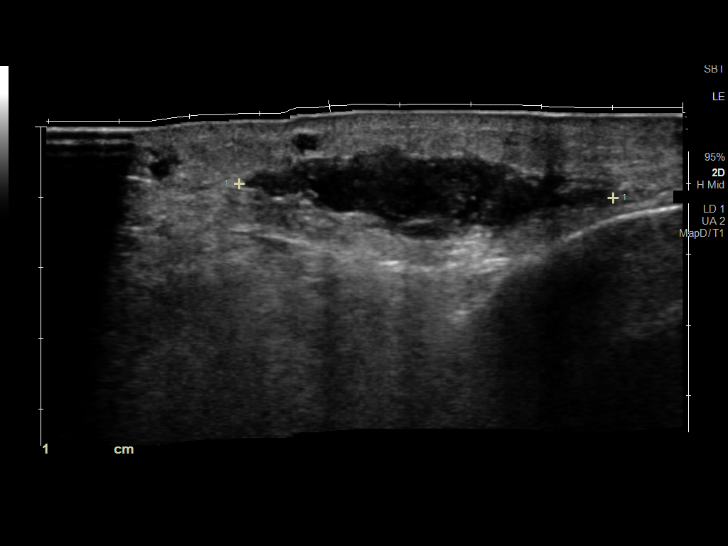

[Series 2: us extrem low venous*left* · 1 of 3 slices shown (2 of 2)]
[im 3/3]
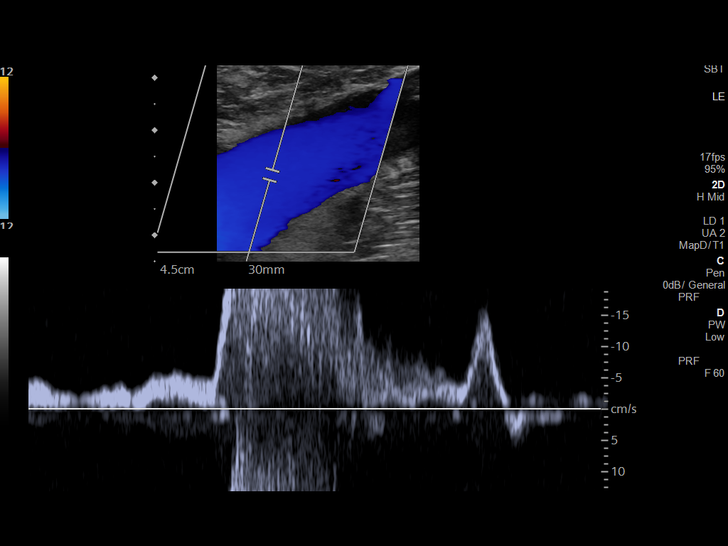

[13 of 24 positions shown; findings below may reference images not displayed]

FINDINGS: Contralateral Common Femoral Vein: Respiratory phasicity is normal
and symmetric with the symptomatic side. No evidence of thrombus.
Normal compressibility.

Common Femoral Vein: No evidence of thrombus. Normal
compressibility, respiratory phasicity and response to augmentation.

Saphenofemoral Junction: No evidence of thrombus. Normal
compressibility and flow on color Doppler imaging.

Profunda Femoral Vein: No evidence of thrombus. Normal
compressibility and flow on color Doppler imaging.

Femoral Vein: No evidence of thrombus. Normal compressibility,
respiratory phasicity and response to augmentation.

Popliteal Vein: No evidence of thrombus. Normal compressibility,
respiratory phasicity and response to augmentation.

Calf Veins: No evidence of thrombus. Normal compressibility and flow
on color Doppler imaging.

Superficial Great Saphenous Vein: No evidence of thrombus. Normal
compressibility.

Venous Reflux:  None.

Other Findings: In the left medial anterior calf there is a complex
collection measuring 8.3 x 1.2 x 5.3 cm.
IMPRESSION: 1. No DVT.
2. In the medial anterior calf there is a complex fluid collection
measuring approximately 8.3 x 1.2 x 5.3 cm. This is favored to
represent a hematoma in the setting of trauma.

## 2021-05-27 DIAGNOSIS — Z23 Encounter for immunization: Secondary | ICD-10-CM | POA: Diagnosis not present

## 2021-05-27 DIAGNOSIS — G43009 Migraine without aura, not intractable, without status migrainosus: Secondary | ICD-10-CM | POA: Diagnosis not present

## 2021-05-27 DIAGNOSIS — E785 Hyperlipidemia, unspecified: Secondary | ICD-10-CM | POA: Diagnosis not present

## 2021-05-27 DIAGNOSIS — I10 Essential (primary) hypertension: Secondary | ICD-10-CM | POA: Diagnosis not present

## 2021-05-27 DIAGNOSIS — Z0000 Encounter for general adult medical examination without abnormal findings: Secondary | ICD-10-CM | POA: Diagnosis not present

## 2021-05-27 DIAGNOSIS — N529 Male erectile dysfunction, unspecified: Secondary | ICD-10-CM | POA: Diagnosis not present

## 2021-05-30 DIAGNOSIS — I10 Essential (primary) hypertension: Secondary | ICD-10-CM | POA: Diagnosis not present

## 2021-05-30 DIAGNOSIS — Z125 Encounter for screening for malignant neoplasm of prostate: Secondary | ICD-10-CM | POA: Diagnosis not present

## 2021-05-30 DIAGNOSIS — E785 Hyperlipidemia, unspecified: Secondary | ICD-10-CM | POA: Diagnosis not present

## 2021-05-30 DIAGNOSIS — E559 Vitamin D deficiency, unspecified: Secondary | ICD-10-CM | POA: Diagnosis not present

## 2021-06-04 DIAGNOSIS — K760 Fatty (change of) liver, not elsewhere classified: Secondary | ICD-10-CM | POA: Diagnosis not present

## 2021-06-04 DIAGNOSIS — K5730 Diverticulosis of large intestine without perforation or abscess without bleeding: Secondary | ICD-10-CM | POA: Diagnosis not present

## 2021-06-04 DIAGNOSIS — K862 Cyst of pancreas: Secondary | ICD-10-CM | POA: Diagnosis not present

## 2021-06-11 DIAGNOSIS — Z23 Encounter for immunization: Secondary | ICD-10-CM | POA: Diagnosis not present

## 2021-09-14 DIAGNOSIS — L089 Local infection of the skin and subcutaneous tissue, unspecified: Secondary | ICD-10-CM | POA: Diagnosis not present

## 2021-09-14 DIAGNOSIS — M4722 Other spondylosis with radiculopathy, cervical region: Secondary | ICD-10-CM | POA: Diagnosis not present

## 2021-09-14 DIAGNOSIS — M542 Cervicalgia: Secondary | ICD-10-CM | POA: Diagnosis not present

## 2021-09-14 DIAGNOSIS — M5412 Radiculopathy, cervical region: Secondary | ICD-10-CM | POA: Diagnosis not present

## 2021-09-14 DIAGNOSIS — L723 Sebaceous cyst: Secondary | ICD-10-CM | POA: Diagnosis not present

## 2021-09-14 DIAGNOSIS — Z981 Arthrodesis status: Secondary | ICD-10-CM | POA: Diagnosis not present

## 2021-09-23 DIAGNOSIS — L089 Local infection of the skin and subcutaneous tissue, unspecified: Secondary | ICD-10-CM | POA: Diagnosis not present

## 2021-09-23 DIAGNOSIS — L720 Epidermal cyst: Secondary | ICD-10-CM | POA: Diagnosis not present

## 2021-09-23 DIAGNOSIS — E559 Vitamin D deficiency, unspecified: Secondary | ICD-10-CM | POA: Diagnosis not present

## 2022-05-31 DIAGNOSIS — I10 Essential (primary) hypertension: Secondary | ICD-10-CM | POA: Diagnosis not present

## 2022-05-31 DIAGNOSIS — Z0000 Encounter for general adult medical examination without abnormal findings: Secondary | ICD-10-CM | POA: Diagnosis not present

## 2022-05-31 DIAGNOSIS — E785 Hyperlipidemia, unspecified: Secondary | ICD-10-CM | POA: Diagnosis not present

## 2022-05-31 DIAGNOSIS — N529 Male erectile dysfunction, unspecified: Secondary | ICD-10-CM | POA: Diagnosis not present

## 2022-05-31 DIAGNOSIS — E559 Vitamin D deficiency, unspecified: Secondary | ICD-10-CM | POA: Diagnosis not present

## 2022-06-04 DIAGNOSIS — N400 Enlarged prostate without lower urinary tract symptoms: Secondary | ICD-10-CM | POA: Diagnosis not present

## 2022-06-04 DIAGNOSIS — E785 Hyperlipidemia, unspecified: Secondary | ICD-10-CM | POA: Diagnosis not present

## 2022-06-04 DIAGNOSIS — E559 Vitamin D deficiency, unspecified: Secondary | ICD-10-CM | POA: Diagnosis not present

## 2022-06-04 DIAGNOSIS — E669 Obesity, unspecified: Secondary | ICD-10-CM | POA: Diagnosis not present

## 2022-06-04 DIAGNOSIS — I10 Essential (primary) hypertension: Secondary | ICD-10-CM | POA: Diagnosis not present

## 2022-09-06 DIAGNOSIS — S46812A Strain of other muscles, fascia and tendons at shoulder and upper arm level, left arm, initial encounter: Secondary | ICD-10-CM | POA: Diagnosis not present

## 2022-10-07 ENCOUNTER — Encounter: Payer: Self-pay | Admitting: *Deleted

## 2023-02-18 DIAGNOSIS — M25562 Pain in left knee: Secondary | ICD-10-CM | POA: Diagnosis not present

## 2023-03-16 ENCOUNTER — Ambulatory Visit: Payer: BC Managed Care – PPO | Admitting: Sports Medicine

## 2023-03-16 ENCOUNTER — Encounter: Payer: Self-pay | Admitting: Sports Medicine

## 2023-03-16 VITALS — BP 124/78 | Ht 72.0 in | Wt 250.0 lb

## 2023-03-16 DIAGNOSIS — M25562 Pain in left knee: Secondary | ICD-10-CM

## 2023-03-16 DIAGNOSIS — M25561 Pain in right knee: Secondary | ICD-10-CM

## 2023-03-16 MED ORDER — METHYLPREDNISOLONE ACETATE 40 MG/ML IJ SUSP
40.0000 mg | Freq: Once | INTRAMUSCULAR | Status: AC
Start: 2023-03-16 — End: 2023-03-16
  Administered 2023-03-16: 40 mg via INTRA_ARTICULAR

## 2023-03-16 NOTE — Progress Notes (Signed)
Subjective:    Patient ID: Kenneth Gillespie, male    DOB: [birth year], 62 y.o.   MRN: [id number]  HPI chief complaint: Bilateral knee pain  Patient is a very pleasant 61 year old male that comes in with a couple different complaints.  Main complaint is left knee pain that began 3 months ago without injury.  He was kneeling in church and as he stood up and felt a pop in his knee.  Not too much pain at that time but he began to experience pain the following day.  He was eventually seen at an urgent care where x-rays showed a very mild amount of arthritis in his knee.  He was placed on 600 mg of ibuprofen and he was initially taking that 4 times a day.  Although that has been helpful it has not been curative.  He has now weaned to taking it only as needed.  He did not notice any swelling in the knee.  Most of his pain is in the medial knee.  No feelings of instability.  No painful popping.  He did have a previous knee injury in 2020 which improved with a cortisone injection.  He is also describing right knee pain that began after his left knee pain started.  He believes it is secondary to compensation.  It is more of a general ache throughout the knee.  Past medical history reviewed Medications reviewed Allergies reviewed    Review of Systems As above    Objective:   Physical Exam  Well-developed, well-nourished.  No acute distress  Examination of both knees show full range of motion.  No effusion bilaterally.  There is tenderness to palpation along the medial joint line on the left with a positive Thessaly.  No tenderness to palpation along the lateral joint line.  Right knee shows no tenderness along either joint line.  Both knees are stable to ligamentous exam.  Neurovascularly intact distally.  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  This was accomplished atraumatically under sterile technique utilizing an anterior lateral approach.  He tolerates this without difficulty.  He has been using a compression sleeve but it has not been very effective.  I would not recommend a bulkier double upright brace.  If left knee pain persist despite today's injection then consider further diagnostic imaging to rule out medial meniscal tear.  Follow-up for ongoing or recalcitrant issues.  Consent obtained and verified. Time-out conducted. Noted no overlying erythema, induration, or other signs of local infection. Skin prepped in a sterile fashion. Topical analgesic spray: Ethyl chloride. Joint: Left knee Needle: 25-gauge 1.5 inch Completed without difficulty. Meds: 3 cc 1% Xylocaine, 1 cc (40 mg) Depo-Medrol  Consent obtained and verified. Time-out conducted. Noted no overlying erythema, induration, or other signs of local infection. Skin prepped in a sterile fashion. Topical analgesic spray: Ethyl chloride. Joint: Right knee Needle: 25-gauge 1.5 inch Completed without difficulty. Meds: 3 cc 1% Xylocaine, 1 cc (40 mg) Depo-Medrol  This note was dictated using Dragon naturally speaking software and may contain errors in syntax, spelling, or content which have not been identified prior to signing this note.

## 2023-03-16 NOTE — Addendum Note (Signed)
Addended by: Merrilyn Puma on: 03/16/2023 03:56 PM   Modules accepted: Orders

## 2023-04-17 DIAGNOSIS — S83242A Other tear of medial meniscus, current injury, left knee, initial encounter: Secondary | ICD-10-CM | POA: Diagnosis not present

## 2023-04-17 DIAGNOSIS — M25462 Effusion, left knee: Secondary | ICD-10-CM | POA: Diagnosis not present

## 2023-04-17 DIAGNOSIS — M1712 Unilateral primary osteoarthritis, left knee: Secondary | ICD-10-CM | POA: Diagnosis not present

## 2023-05-04 ENCOUNTER — Telehealth: Payer: Self-pay

## 2023-05-04 NOTE — Telephone Encounter (Signed)
Patient called office stating that he had MRI in October at [facility] patient states that his left knee still hurts after cortisone shot, [facility] information is PHONE: [phone] Fax: [phone], Please advise, thanks.

## 2023-05-10 ENCOUNTER — Ambulatory Visit: Payer: BC Managed Care – PPO | Admitting: Sports Medicine

## 2023-05-10 ENCOUNTER — Encounter: Payer: Self-pay | Admitting: Sports Medicine

## 2023-05-10 VITALS — BP 116/78 | Ht 72.0 in | Wt 250.0 lb

## 2023-05-10 DIAGNOSIS — S83207D Unspecified tear of unspecified meniscus, current injury, left knee, subsequent encounter: Secondary | ICD-10-CM

## 2023-05-10 NOTE — Progress Notes (Signed)
Patient ID: Kenneth Gillespie, male   DOB: June 28, 1960, 62 y.o.   MRN: [id number]  Kenneth Gillespie presents today to discuss ongoing left knee pain.  He recently had an MRI which shows radial tears through the medial meniscus as well as a small displaced meniscal fragment.  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  His main complaint continues to be pain especially with activity such as going downstairs or squatting.  He denies any mechanical symptoms.  He has had 2 cortisone injections which have not provided him with much relief.  Physical exam was not repeated other than a Thessaly's which remains positive.  I recommended consultation with Dr. Jerl Santos to discuss next steps in treatment.  He may benefit from a knee arthroscopy or Dr. Jerl Santos may elect to try viscosupplementation first.  In the meantime, I did recommend that he try over-the-counter Voltaren gel.  I will defer further workup and treatment to the discretion of Dr. Jerl Santos and the patient will follow-up with me as needed.  This note was dictated using Dragon naturally speaking software and may contain errors in syntax, spelling, or content which have not been identified prior to signing this note.

## 2023-05-25 DIAGNOSIS — M25562 Pain in left knee: Secondary | ICD-10-CM | POA: Diagnosis not present

## 2023-06-06 DIAGNOSIS — G43009 Migraine without aura, not intractable, without status migrainosus: Secondary | ICD-10-CM | POA: Diagnosis not present

## 2023-06-06 DIAGNOSIS — Z0000 Encounter for general adult medical examination without abnormal findings: Secondary | ICD-10-CM | POA: Diagnosis not present

## 2023-06-06 DIAGNOSIS — K862 Cyst of pancreas: Secondary | ICD-10-CM | POA: Diagnosis not present

## 2023-06-06 DIAGNOSIS — I10 Essential (primary) hypertension: Secondary | ICD-10-CM | POA: Diagnosis not present

## 2023-06-06 DIAGNOSIS — E785 Hyperlipidemia, unspecified: Secondary | ICD-10-CM | POA: Diagnosis not present

## 2023-06-07 DIAGNOSIS — Z1159 Encounter for screening for other viral diseases: Secondary | ICD-10-CM | POA: Diagnosis not present

## 2023-06-07 DIAGNOSIS — R7303 Prediabetes: Secondary | ICD-10-CM | POA: Diagnosis not present

## 2023-06-07 DIAGNOSIS — E785 Hyperlipidemia, unspecified: Secondary | ICD-10-CM | POA: Diagnosis not present

## 2023-06-07 DIAGNOSIS — N400 Enlarged prostate without lower urinary tract symptoms: Secondary | ICD-10-CM | POA: Diagnosis not present

## 2023-06-07 DIAGNOSIS — Z0000 Encounter for general adult medical examination without abnormal findings: Secondary | ICD-10-CM | POA: Diagnosis not present

## 2023-06-09 DIAGNOSIS — K862 Cyst of pancreas: Secondary | ICD-10-CM | POA: Diagnosis not present

## 2023-06-09 DIAGNOSIS — K5730 Diverticulosis of large intestine without perforation or abscess without bleeding: Secondary | ICD-10-CM | POA: Diagnosis not present

## 2023-06-09 DIAGNOSIS — K5790 Diverticulosis of intestine, part unspecified, without perforation or abscess without bleeding: Secondary | ICD-10-CM | POA: Diagnosis not present

## 2023-06-30 DIAGNOSIS — S83282A Other tear of lateral meniscus, current injury, left knee, initial encounter: Secondary | ICD-10-CM | POA: Diagnosis not present

## 2023-06-30 DIAGNOSIS — S83232A Complex tear of medial meniscus, current injury, left knee, initial encounter: Secondary | ICD-10-CM | POA: Diagnosis not present

## 2023-06-30 DIAGNOSIS — M2242 Chondromalacia patellae, left knee: Secondary | ICD-10-CM | POA: Diagnosis not present

## 2023-06-30 DIAGNOSIS — X58XXXA Exposure to other specified factors, initial encounter: Secondary | ICD-10-CM | POA: Diagnosis not present

## 2023-06-30 DIAGNOSIS — M1712 Unilateral primary osteoarthritis, left knee: Secondary | ICD-10-CM | POA: Diagnosis not present

## 2023-06-30 DIAGNOSIS — S83242A Other tear of medial meniscus, current injury, left knee, initial encounter: Secondary | ICD-10-CM | POA: Diagnosis not present

## 2023-06-30 DIAGNOSIS — G8918 Other acute postprocedural pain: Secondary | ICD-10-CM | POA: Diagnosis not present

## 2023-06-30 DIAGNOSIS — Y999 Unspecified external cause status: Secondary | ICD-10-CM | POA: Diagnosis not present

## 2023-07-06 DIAGNOSIS — R531 Weakness: Secondary | ICD-10-CM | POA: Diagnosis not present

## 2023-07-06 DIAGNOSIS — M25662 Stiffness of left knee, not elsewhere classified: Secondary | ICD-10-CM | POA: Diagnosis not present

## 2023-07-08 DIAGNOSIS — R531 Weakness: Secondary | ICD-10-CM | POA: Diagnosis not present

## 2023-07-08 DIAGNOSIS — M25662 Stiffness of left knee, not elsewhere classified: Secondary | ICD-10-CM | POA: Diagnosis not present

## 2023-07-13 DIAGNOSIS — M25662 Stiffness of left knee, not elsewhere classified: Secondary | ICD-10-CM | POA: Diagnosis not present

## 2023-07-13 DIAGNOSIS — R531 Weakness: Secondary | ICD-10-CM | POA: Diagnosis not present

## 2023-07-15 DIAGNOSIS — M25662 Stiffness of left knee, not elsewhere classified: Secondary | ICD-10-CM | POA: Diagnosis not present

## 2023-07-15 DIAGNOSIS — R531 Weakness: Secondary | ICD-10-CM | POA: Diagnosis not present

## 2023-07-19 DIAGNOSIS — R531 Weakness: Secondary | ICD-10-CM | POA: Diagnosis not present

## 2023-07-19 DIAGNOSIS — M25662 Stiffness of left knee, not elsewhere classified: Secondary | ICD-10-CM | POA: Diagnosis not present

## 2023-07-26 DIAGNOSIS — M25662 Stiffness of left knee, not elsewhere classified: Secondary | ICD-10-CM | POA: Diagnosis not present

## 2023-07-26 DIAGNOSIS — R531 Weakness: Secondary | ICD-10-CM | POA: Diagnosis not present

## 2023-07-28 DIAGNOSIS — R531 Weakness: Secondary | ICD-10-CM | POA: Diagnosis not present

## 2023-07-28 DIAGNOSIS — M25662 Stiffness of left knee, not elsewhere classified: Secondary | ICD-10-CM | POA: Diagnosis not present

## 2023-08-08 DIAGNOSIS — M25662 Stiffness of left knee, not elsewhere classified: Secondary | ICD-10-CM | POA: Diagnosis not present

## 2023-08-08 DIAGNOSIS — R531 Weakness: Secondary | ICD-10-CM | POA: Diagnosis not present

## 2023-08-22 DIAGNOSIS — R531 Weakness: Secondary | ICD-10-CM | POA: Diagnosis not present

## 2023-08-22 DIAGNOSIS — M25662 Stiffness of left knee, not elsewhere classified: Secondary | ICD-10-CM | POA: Diagnosis not present

## 2023-08-31 DIAGNOSIS — R531 Weakness: Secondary | ICD-10-CM | POA: Diagnosis not present

## 2023-08-31 DIAGNOSIS — M25662 Stiffness of left knee, not elsewhere classified: Secondary | ICD-10-CM | POA: Diagnosis not present

## 2023-09-05 DIAGNOSIS — M25662 Stiffness of left knee, not elsewhere classified: Secondary | ICD-10-CM | POA: Diagnosis not present

## 2023-09-07 DIAGNOSIS — M25662 Stiffness of left knee, not elsewhere classified: Secondary | ICD-10-CM | POA: Diagnosis not present

## 2023-09-07 DIAGNOSIS — R531 Weakness: Secondary | ICD-10-CM | POA: Diagnosis not present

## 2023-09-14 DIAGNOSIS — M25662 Stiffness of left knee, not elsewhere classified: Secondary | ICD-10-CM | POA: Diagnosis not present

## 2023-09-14 DIAGNOSIS — R531 Weakness: Secondary | ICD-10-CM | POA: Diagnosis not present

## 2023-09-21 DIAGNOSIS — M25662 Stiffness of left knee, not elsewhere classified: Secondary | ICD-10-CM | POA: Diagnosis not present

## 2023-09-21 DIAGNOSIS — R531 Weakness: Secondary | ICD-10-CM | POA: Diagnosis not present

## 2023-09-26 DIAGNOSIS — H539 Unspecified visual disturbance: Secondary | ICD-10-CM | POA: Diagnosis not present

## 2023-09-27 DIAGNOSIS — H43812 Vitreous degeneration, left eye: Secondary | ICD-10-CM | POA: Diagnosis not present

## 2023-09-29 DIAGNOSIS — R531 Weakness: Secondary | ICD-10-CM | POA: Diagnosis not present

## 2023-09-29 DIAGNOSIS — M25662 Stiffness of left knee, not elsewhere classified: Secondary | ICD-10-CM | POA: Diagnosis not present

## 2023-10-12 DIAGNOSIS — M25662 Stiffness of left knee, not elsewhere classified: Secondary | ICD-10-CM | POA: Diagnosis not present

## 2023-10-12 DIAGNOSIS — R531 Weakness: Secondary | ICD-10-CM | POA: Diagnosis not present

## 2023-10-20 DIAGNOSIS — H43812 Vitreous degeneration, left eye: Secondary | ICD-10-CM | POA: Diagnosis not present

## 2023-11-02 DIAGNOSIS — M25662 Stiffness of left knee, not elsewhere classified: Secondary | ICD-10-CM | POA: Diagnosis not present

## 2023-12-13 DIAGNOSIS — I83893 Varicose veins of bilateral lower extremities with other complications: Secondary | ICD-10-CM | POA: Diagnosis not present

## 2023-12-13 DIAGNOSIS — M79661 Pain in right lower leg: Secondary | ICD-10-CM | POA: Diagnosis not present

## 2023-12-13 DIAGNOSIS — M79604 Pain in right leg: Secondary | ICD-10-CM | POA: Diagnosis not present

## 2023-12-13 DIAGNOSIS — I83891 Varicose veins of right lower extremities with other complications: Secondary | ICD-10-CM | POA: Diagnosis not present

## 2023-12-13 DIAGNOSIS — R600 Localized edema: Secondary | ICD-10-CM | POA: Diagnosis not present

## 2024-01-02 DIAGNOSIS — I83891 Varicose veins of right lower extremities with other complications: Secondary | ICD-10-CM | POA: Diagnosis not present

## 2024-01-24 DIAGNOSIS — I83891 Varicose veins of right lower extremities with other complications: Secondary | ICD-10-CM | POA: Diagnosis not present

## 2024-01-24 DIAGNOSIS — I87391 Chronic venous hypertension (idiopathic) with other complications of right lower extremity: Secondary | ICD-10-CM | POA: Diagnosis not present

## 2024-02-07 DIAGNOSIS — I87391 Chronic venous hypertension (idiopathic) with other complications of right lower extremity: Secondary | ICD-10-CM | POA: Diagnosis not present

## 2024-02-07 DIAGNOSIS — I83891 Varicose veins of right lower extremities with other complications: Secondary | ICD-10-CM | POA: Diagnosis not present

## 2024-02-21 DIAGNOSIS — I83891 Varicose veins of right lower extremities with other complications: Secondary | ICD-10-CM | POA: Diagnosis not present

## 2024-03-28 DIAGNOSIS — R600 Localized edema: Secondary | ICD-10-CM | POA: Diagnosis not present

## 2024-03-28 DIAGNOSIS — R0602 Shortness of breath: Secondary | ICD-10-CM | POA: Diagnosis not present

## 2024-03-28 DIAGNOSIS — M25562 Pain in left knee: Secondary | ICD-10-CM | POA: Diagnosis not present

## 2024-04-11 DIAGNOSIS — M79661 Pain in right lower leg: Secondary | ICD-10-CM | POA: Diagnosis not present

## 2024-04-11 DIAGNOSIS — M79604 Pain in right leg: Secondary | ICD-10-CM | POA: Diagnosis not present

## 2024-04-11 DIAGNOSIS — I872 Venous insufficiency (chronic) (peripheral): Secondary | ICD-10-CM | POA: Diagnosis not present

## 2024-04-16 DIAGNOSIS — M7061 Trochanteric bursitis, right hip: Secondary | ICD-10-CM | POA: Diagnosis not present

## 2024-04-16 DIAGNOSIS — M25562 Pain in left knee: Secondary | ICD-10-CM | POA: Diagnosis not present

## 2024-04-23 DIAGNOSIS — K648 Other hemorrhoids: Secondary | ICD-10-CM | POA: Diagnosis not present

## 2024-04-23 DIAGNOSIS — Z1211 Encounter for screening for malignant neoplasm of colon: Secondary | ICD-10-CM | POA: Diagnosis not present

## 2024-04-23 DIAGNOSIS — K5730 Diverticulosis of large intestine without perforation or abscess without bleeding: Secondary | ICD-10-CM | POA: Diagnosis not present

## 2024-05-31 DIAGNOSIS — Z125 Encounter for screening for malignant neoplasm of prostate: Secondary | ICD-10-CM | POA: Diagnosis not present

## 2024-05-31 DIAGNOSIS — N401 Enlarged prostate with lower urinary tract symptoms: Secondary | ICD-10-CM | POA: Diagnosis not present

## 2024-05-31 DIAGNOSIS — N529 Male erectile dysfunction, unspecified: Secondary | ICD-10-CM | POA: Diagnosis not present

## 2024-05-31 DIAGNOSIS — R6882 Decreased libido: Secondary | ICD-10-CM | POA: Diagnosis not present

## 2024-06-05 DIAGNOSIS — Z6837 Body mass index (BMI) 37.0-37.9, adult: Secondary | ICD-10-CM | POA: Diagnosis not present

## 2024-06-05 DIAGNOSIS — E785 Hyperlipidemia, unspecified: Secondary | ICD-10-CM | POA: Diagnosis not present

## 2024-06-05 DIAGNOSIS — I10 Essential (primary) hypertension: Secondary | ICD-10-CM | POA: Diagnosis not present

## 2024-06-05 DIAGNOSIS — R7303 Prediabetes: Secondary | ICD-10-CM | POA: Diagnosis not present

## 2024-06-05 DIAGNOSIS — G43009 Migraine without aura, not intractable, without status migrainosus: Secondary | ICD-10-CM | POA: Diagnosis not present

## 2024-06-05 DIAGNOSIS — Z0001 Encounter for general adult medical examination with abnormal findings: Secondary | ICD-10-CM | POA: Diagnosis not present

## 2024-06-05 DIAGNOSIS — R0609 Other forms of dyspnea: Secondary | ICD-10-CM | POA: Diagnosis not present

## 2024-06-07 DIAGNOSIS — G2581 Restless legs syndrome: Secondary | ICD-10-CM | POA: Diagnosis not present

## 2024-06-07 DIAGNOSIS — I83891 Varicose veins of right lower extremities with other complications: Secondary | ICD-10-CM | POA: Diagnosis not present

## 2024-06-07 DIAGNOSIS — I87391 Chronic venous hypertension (idiopathic) with other complications of right lower extremity: Secondary | ICD-10-CM | POA: Diagnosis not present

## 2024-06-07 DIAGNOSIS — R600 Localized edema: Secondary | ICD-10-CM | POA: Diagnosis not present

## 2024-06-13 DIAGNOSIS — R7303 Prediabetes: Secondary | ICD-10-CM | POA: Diagnosis not present

## 2024-06-13 DIAGNOSIS — E538 Deficiency of other specified B group vitamins: Secondary | ICD-10-CM | POA: Diagnosis not present

## 2024-06-13 DIAGNOSIS — E785 Hyperlipidemia, unspecified: Secondary | ICD-10-CM | POA: Diagnosis not present

## 2024-06-13 DIAGNOSIS — N401 Enlarged prostate with lower urinary tract symptoms: Secondary | ICD-10-CM | POA: Diagnosis not present

## 2024-06-13 DIAGNOSIS — R6882 Decreased libido: Secondary | ICD-10-CM | POA: Diagnosis not present

## 2024-06-13 DIAGNOSIS — E559 Vitamin D deficiency, unspecified: Secondary | ICD-10-CM | POA: Diagnosis not present

## 2024-06-13 DIAGNOSIS — Z0000 Encounter for general adult medical examination without abnormal findings: Secondary | ICD-10-CM | POA: Diagnosis not present

## 2024-06-13 DIAGNOSIS — N138 Other obstructive and reflux uropathy: Secondary | ICD-10-CM | POA: Diagnosis not present

## 2024-06-13 DIAGNOSIS — N529 Male erectile dysfunction, unspecified: Secondary | ICD-10-CM | POA: Diagnosis not present

## 2024-06-13 DIAGNOSIS — Z125 Encounter for screening for malignant neoplasm of prostate: Secondary | ICD-10-CM | POA: Diagnosis not present

## 2024-06-19 DIAGNOSIS — I83891 Varicose veins of right lower extremities with other complications: Secondary | ICD-10-CM | POA: Diagnosis not present

## 2024-06-19 DIAGNOSIS — I87391 Chronic venous hypertension (idiopathic) with other complications of right lower extremity: Secondary | ICD-10-CM | POA: Diagnosis not present
